# Patient Record
Sex: Male | Born: 1970 | Race: Black or African American | Hispanic: No | Marital: Single | Smoking: Never smoker
Health system: Southern US, Community
[De-identification: ages and names within clinical notes are randomized; demographics above are authoritative.]

## PROBLEM LIST (undated history)

## (undated) HISTORY — PX: CHOLECYSTECTOMY: SHX55

---

## 2010-09-11 ENCOUNTER — Emergency Department: Payer: Self-pay | Admitting: Emergency Medicine

## 2011-02-13 ENCOUNTER — Emergency Department: Payer: Self-pay | Admitting: Emergency Medicine

## 2012-06-21 ENCOUNTER — Emergency Department: Payer: Self-pay | Admitting: Emergency Medicine

## 2019-08-17 ENCOUNTER — Other Ambulatory Visit: Payer: Self-pay

## 2019-08-17 ENCOUNTER — Ambulatory Visit
Admission: EM | Admit: 2019-08-17 | Discharge: 2019-08-17 | Disposition: A | Discharge status: Home / Self Care | Payer: Self-pay | Attending: Internal Medicine | Admitting: Internal Medicine

## 2019-08-17 ENCOUNTER — Encounter: Payer: Self-pay | Admitting: Emergency Medicine

## 2019-08-17 DIAGNOSIS — L739 Follicular disorder, unspecified: Secondary | ICD-10-CM

## 2019-08-17 MED ORDER — CEPHALEXIN 500 MG PO CAPS
ORAL_CAPSULE | ORAL | 0 refills | Status: DC
Start: 2019-08-17 — End: 2020-04-27

## 2019-08-17 NOTE — ED Provider Notes (Addendum)
[facility]    CSN: [id number] Arrival date & time: 08/17/19  1028      History   Chief Complaint Chief Complaint  Patient presents with  . Rash    HPI Levi Payne. is a 49 y.o. male. who presents with onset of L scalp rash x 1 month off and on. He shaves his scalp and has used topical antibacterial ointment on it, but has not help it resolve. This area gets itchy. Denies having any other symptoms.     History reviewed. No pertinent past medical history.  There are no problems to display for this patient.   History reviewed. No pertinent surgical history.     Home Medications    Prior to Admission medications   Medication Sig Start Date End Date Taking? Authorizing Provider  cephALEXin (KEFLEX) 500 MG capsule 2 bid x 10 days 08/17/19   Rodriguez-Southworth, Nettie Elm, PA-C    Family History Family History  Problem Relation Age of Onset  . Healthy Mother     Social History Social History   Tobacco Use  . Smoking status: Never Smoker  . Smokeless tobacco: Never Used  Vaping Use  . Vaping Use: Never used  Substance Use Topics  . Alcohol use: Not Currently  . Drug use: Never     Allergies   Patient has no known allergies.   Review of Systems Review of Systems  Cardiovascular: Negative for chest pain.  Skin: Positive for rash. Negative for color change, pallor and wound.       + pruritus  Neurological: Negative for headaches.     Physical Exam Triage Vital Signs ED Triage Vitals  Enc Vitals Group     BP 08/17/19 1045 (!) 187/98     Pulse Rate 08/17/19 1045 64     Resp 08/17/19 1045 17     Temp 08/17/19 1045 98 F (36.7 C)     Temp Source 08/17/19 1045 Oral     SpO2 08/17/19 1045 99 %     Weight 08/17/19 1043 (!) 380 lb (172.4 kg)     Height 08/17/19 1043 5\' 7"  (1.702 m)     Head Circumference --      Peak Flow --      Pain Score 08/17/19 1043 0     Pain Loc --      Pain Edu? --      Excl. in GC? --    No data  found.  Updated Vital Signs BP (!) 187/98 (BP Location: Left Arm)   Pulse 64   Temp 98 F (36.7 C) (Oral)   Resp 17   Ht 5\' 7"  (1.702 m)   Wt (!) 380 lb (172.4 kg)   SpO2 99%   BMI 59.52 kg/m   Visual Acuity Right Eye Distance:   Left Eye Distance:   Bilateral Distance:    Right Eye Near:   Left Eye Near:    Bilateral Near:     Physical Exam Constitutional:      General: He is not in acute distress.    Appearance: He is obese. He is not toxic-appearing.  HENT:     Head: Atraumatic.     Comments: Has multiple pustules on his L lateral scalp, but no erythema around it.     Right Ear: External ear normal.     Left Ear: External ear normal.     Nose: Nose normal.  Eyes:     General: No scleral icterus.  Conjunctiva/sclera: Conjunctivae normal.  Pulmonary:     Effort: Pulmonary effort is normal.  Musculoskeletal:        General: Normal range of motion.     Cervical back: Neck supple.  Skin:    General: Skin is warm and dry.     Findings: Rash present.  Neurological:     Mental Status: He is alert and oriented to person, place, and time.  Psychiatric:        Mood and Affect: Mood normal.        Behavior: Behavior normal.        Thought Content: Thought content normal.        Judgment: Judgment normal.      UC Treatments / Results  Labs (all labs ordered are listed, but only abnormal results are displayed) Labs Reviewed - No data to display  EKG   Radiology No results found.  Procedures Procedures (including critical care time)  Medications Ordered in UC Medications - No data to display  Initial Impression / Assessment and Plan / UC Course  I have reviewed the triage vital signs and the nursing notes. Has folliculitis and advised to stop shaving, scrub scalp with rough sponge and I placed him on Keflex as noted.  Final Clinical Impressions(s) / UC Diagnoses   Final diagnoses:  Folliculitis   Discharge Instructions   None    ED  Prescriptions    Medication Sig Dispense Auth. Provider   cephALEXin (KEFLEX) 500 MG capsule 2 bid x 10 days 40 capsule Rodriguez-Southworth, Nettie Elm, PA-C     PDMP not reviewed this encounter.   Garey Ham, PA-C 08/17/19 1107    Rodriguez-Southworth, [place], PA-C 08/17/19 1107

## 2019-08-17 NOTE — ED Triage Notes (Signed)
Patient c/o itchy rash on the left side of his scalp for the past month.

## 2019-09-17 ENCOUNTER — Ambulatory Visit (INDEPENDENT_AMBULATORY_CARE_PROVIDER_SITE_OTHER): Payer: Self-pay

## 2019-09-17 ENCOUNTER — Ambulatory Visit
Admission: EM | Admit: 2019-09-17 | Discharge: 2019-09-17 | Disposition: A | Discharge status: Home / Self Care | Payer: Self-pay | Attending: Family Medicine | Admitting: Family Medicine

## 2019-09-17 ENCOUNTER — Other Ambulatory Visit: Payer: Self-pay

## 2019-09-17 DIAGNOSIS — K8020 Calculus of gallbladder without cholecystitis without obstruction: Secondary | ICD-10-CM | POA: Insufficient documentation

## 2019-09-17 DIAGNOSIS — Z79899 Other long term (current) drug therapy: Secondary | ICD-10-CM | POA: Insufficient documentation

## 2019-09-17 DIAGNOSIS — R1013 Epigastric pain: Secondary | ICD-10-CM | POA: Insufficient documentation

## 2019-09-17 DIAGNOSIS — Z20822 Contact with and (suspected) exposure to covid-19: Secondary | ICD-10-CM | POA: Insufficient documentation

## 2019-09-17 LAB — CBC WITH DIFFERENTIAL/PLATELET
Abs Immature Granulocytes: 0.07 10*3/uL (ref 0.00–0.07)
Basophils Absolute: 0.1 10*3/uL (ref 0.0–0.1)
Basophils Relative: 0 %
Eosinophils Absolute: 0.1 10*3/uL (ref 0.0–0.5)
Eosinophils Relative: 1 %
HCT: 45.5 % (ref 39.0–52.0)
Hemoglobin: 15.4 g/dL (ref 13.0–17.0)
Immature Granulocytes: 1 %
Lymphocytes Relative: 19 %
Lymphs Abs: 2.6 10*3/uL (ref 0.7–4.0)
MCH: 32.9 pg (ref 26.0–34.0)
MCHC: 33.8 g/dL (ref 30.0–36.0)
MCV: 97.2 fL (ref 80.0–100.0)
Monocytes Absolute: 0.7 10*3/uL (ref 0.1–1.0)
Monocytes Relative: 5 %
Neutro Abs: 10.3 10*3/uL — ABNORMAL HIGH (ref 1.7–7.7)
Neutrophils Relative %: 74 %
Platelets: 279 10*3/uL (ref 150–400)
RBC: 4.68 MIL/uL (ref 4.22–5.81)
RDW: 12.8 % (ref 11.5–15.5)
WBC: 13.8 10*3/uL — ABNORMAL HIGH (ref 4.0–10.5)
nRBC: 0 % (ref 0.0–0.2)

## 2019-09-17 LAB — COMPREHENSIVE METABOLIC PANEL
ALT: 116 U/L — ABNORMAL HIGH (ref 0–44)
AST: 89 U/L — ABNORMAL HIGH (ref 15–41)
Albumin: 4.1 g/dL (ref 3.5–5.0)
Alkaline Phosphatase: 96 U/L (ref 38–126)
Anion gap: 10 (ref 5–15)
BUN: 9 mg/dL (ref 6–20)
CO2: 27 mmol/L (ref 22–32)
Calcium: 8.8 mg/dL — ABNORMAL LOW (ref 8.9–10.3)
Chloride: 101 mmol/L (ref 98–111)
Creatinine, Ser: 0.78 mg/dL (ref 0.61–1.24)
GFR calc Af Amer: >60 mL/min (ref >60)
GFR calc non Af Amer: >60 mL/min (ref >60)
Glucose, Bld: 110 mg/dL — ABNORMAL HIGH (ref 70–99)
Potassium: 4.2 mmol/L (ref 3.5–5.1)
Sodium: 138 mmol/L (ref 135–145)
Total Bilirubin: 3.9 mg/dL — ABNORMAL HIGH (ref 0.3–1.2)
Total Protein: 8.3 g/dL — ABNORMAL HIGH (ref 6.5–8.1)

## 2019-09-17 LAB — BILIRUBIN, DIRECT: Bilirubin, Direct: 2.1 mg/dL — ABNORMAL HIGH (ref 0.0–0.2)

## 2019-09-17 LAB — LIPASE, BLOOD: Lipase: 42 U/L (ref 11–51)

## 2019-09-17 MED ORDER — PANTOPRAZOLE SODIUM 40 MG PO TBEC: 40.0000 mg | DELAYED_RELEASE_TABLET | Freq: Every day | ORAL | 0 refills | Status: DC

## 2019-09-17 MED ORDER — ONDANSETRON HCL 4 MG PO TABS: 4.0000 mg | ORAL_TABLET | Freq: Three times a day (TID) | ORAL | 0 refills | Status: DC | PRN

## 2019-09-17 NOTE — Discharge Instructions (Addendum)
Medication as prescribed.  I am referring you to general surgery.  If you worsen, go to the hospital.  Take care  Dr. Adriana Simas

## 2019-09-17 NOTE — ED Provider Notes (Signed)
[facility]    CSN: [id number] Arrival date & time: 09/17/19  8676      History   Chief Complaint Chief Complaint  Patient presents with  . Emesis  . Chills  . Abdominal Pain  . Fever    HPI  49 year old male presents with the above complaints.  Symptoms started 2 days ago.  He reports nausea, vomiting, upper abdominal pain.  Also reports subjective fever and chills.  Currently afebrile.  Patient has been backside against COVID-19.  No respiratory symptoms.  No no relieving factors.  No other associated symptoms.  No other complaints.  Home Medications    Prior to Admission medications   Medication Sig Start Date End Date Taking? Authorizing Provider  cephALEXin (KEFLEX) 500 MG capsule 2 bid x 10 days 08/17/19   Rodriguez-Southworth, Nettie Elm, PA-C  ondansetron (ZOFRAN) 4 MG tablet Take 1 tablet (4 mg total) by mouth every 8 (eight) hours as needed for nausea or vomiting. 09/17/19   Tommie Sams, DO  pantoprazole (PROTONIX) 40 MG tablet Take 1 tablet (40 mg total) by mouth daily. 09/17/19   Tommie Sams, DO    Family History Family History  Problem Relation Age of Onset  . Healthy Mother     Social History Social History   Tobacco Use  . Smoking status: Never Smoker  . Smokeless tobacco: Never Used  Vaping Use  . Vaping Use: Never used  Substance Use Topics  . Alcohol use: Not Currently  . Drug use: Never     Allergies   Patient has no known allergies.   Review of Systems Review of Systems  Constitutional: Positive for chills and fever.  Gastrointestinal: Positive for abdominal pain, nausea and vomiting.   Physical Exam Triage Vital Signs ED Triage Vitals  Enc Vitals Group     BP 09/17/19 0903 (!) 177/111     Pulse Rate 09/17/19 0903 71     Resp 09/17/19 0903 18     Temp 09/17/19 0903 98.9 F (37.2 C)     Temp Source 09/17/19 0903 Oral     SpO2 09/17/19 0903 99 %     Weight 09/17/19 0842 (!) 385 lb (174.6 kg)     Height 09/17/19 0842  5\' 7"  (1.702 m)     Head Circumference --      Peak Flow --      Pain Score 09/17/19 1116 0     Pain Loc --      Pain Edu? --      Excl. in GC? --    Updated Vital Signs BP (!) 177/111 (BP Location: Left Arm)   Pulse 71   Temp 98.9 F (37.2 C) (Oral)   Resp 18   Ht 5\' 7"  (1.702 m)   Wt (!) 174.6 kg   SpO2 99%   BMI 60.30 kg/m   Visual Acuity Right Eye Distance:   Left Eye Distance:   Bilateral Distance:    Right Eye Near:   Left Eye Near:    Bilateral Near:     Physical Exam Vitals and nursing note reviewed.  Constitutional:      Appearance: Normal appearance. He is well-developed.     Comments: Morbidly obese.  No acute distress.  HENT:     Head: Normocephalic and atraumatic.  Eyes:     General:        Right eye: No discharge.        Left eye: No discharge.  Conjunctiva/sclera: Conjunctivae normal.  Cardiovascular:     Rate and Rhythm: Normal rate and regular rhythm.     Heart sounds: No murmur heard.   Pulmonary:     Effort: Pulmonary effort is normal.     Breath sounds: Normal breath sounds. No wheezing, rhonchi or rales.  Abdominal:     General: There is no distension.     Palpations: Abdomen is soft.     Comments: Tenderness to palpation in the epigastric region.  Neurological:     Mental Status: He is alert.  Psychiatric:        Mood and Affect: Mood normal.        Behavior: Behavior normal.    UC Treatments / Results  Labs (all labs ordered are listed, but only abnormal results are displayed) Labs Reviewed  COMPREHENSIVE METABOLIC PANEL - Abnormal; Notable for the following components:      Result Value   Glucose, Bld 110 (*)    Calcium 8.8 (*)    Total Protein 8.3 (*)    AST 89 (*)    ALT 116 (*)    Total Bilirubin 3.9 (*)    All other components within normal limits  CBC WITH DIFFERENTIAL/PLATELET - Abnormal; Notable for the following components:   WBC 13.8 (*)    Neutro Abs 10.3 (*)    All other components within normal limits    SARS CORONAVIRUS 2 (TAT 6-24 HRS)  LIPASE, BLOOD  BILIRUBIN, DIRECT    EKG   Radiology US Abdomen Limited RUQ  Result Date: 09/17/2019 CLINICAL DATA:  Epigastric pain for 3 days EXAM: ULTRASOUND ABDOMEN LIMITED RIGHT UPPER QUADRANT COMPARISON:  None. FINDINGS: Gallbladder: Cholelithiasis/sludge filling most of the gallbladder. Where the wall is visible there is no thickening. No pericholecystic edema or focal tenderness. Common bile duct: Diameter: 5 mm Liver: Limited visualization of the liver with no focal lesions seen. Portal vein is patent on color Doppler imaging with normal direction of blood flow towards the liver. IMPRESSION: Cholelithiasis without findings of acute cholecystitis. Electronically Signed   By: Marnee Spring M.D.   On: 09/17/2019 10:28    Procedures Procedures (including critical care time)  Medications Ordered in UC Medications - No data to display  Initial Impression / Assessment and Plan / UC Course  I have reviewed the triage vital signs and the nursing notes.  Pertinent labs & imaging results that were available during my care of the patient were reviewed by me and considered in my medical decision making (see chart for details).    49 year old male presents with epigastric pain, nausea, vomiting.  No documented fever.  Afebrile currently.  Laboratory studies notable for leukocytosis of 13.8.  Patient also had elevated LFTs but a normal alkaline phosphatase.  Bili elevated at 3.9.  Direct bilirubin is pending at this time.  Right upper quadrant ultrasound was obtained and revealed cholelithiasis without evidence of acute cholecystitis.  No choledocholithiasis.  Patient stable to go home at this time.  Awaiting further evaluation of direct and indirect bilirubin.  Placed on Protonix.  Zofran as needed.  Referring to general surgery.  Final Clinical Impressions(s) / UC Diagnoses   Final diagnoses:  Epigastric pain  Calculus of gallbladder without  cholecystitis without obstruction     Discharge Instructions     Medication as prescribed.  I am referring you to general surgery.  If you worsen, go to the hospital.  Take care  Dr. Adriana Simas    ED Prescriptions  Medication Sig Dispense Auth. Provider   ondansetron (ZOFRAN) 4 MG tablet Take 1 tablet (4 mg total) by mouth every 8 (eight) hours as needed for nausea or vomiting. 20 tablet Georgene Kopper G, DO   pantoprazole (PROTONIX) 40 MG tablet Take 1 tablet (40 mg total) by mouth daily. 30 tablet Tommie Sams, DO     PDMP not reviewed this encounter.   Tommie Sams, [place] 09/17/19 1206

## 2019-09-17 NOTE — ED Triage Notes (Signed)
Patient in today w/ c/o vomiting, fever, chills, and abdominal pain x 2 days. Patient received his 2nd dose of Moderna vaccine 09/07/2019. Patient denies loss of taste or smell, cough, or sinus congestion and drainage.

## 2019-09-18 ENCOUNTER — Other Ambulatory Visit: Payer: Self-pay

## 2019-09-18 ENCOUNTER — Emergency Department
Admission: EM | Admit: 2019-09-18 | Discharge: 2019-09-18 | Disposition: A | Discharge status: Home / Self Care | Payer: Self-pay | Attending: Emergency Medicine | Admitting: Emergency Medicine

## 2019-09-18 DIAGNOSIS — K8020 Calculus of gallbladder without cholecystitis without obstruction: Secondary | ICD-10-CM | POA: Insufficient documentation

## 2019-09-18 DIAGNOSIS — R1011 Right upper quadrant pain: Secondary | ICD-10-CM | POA: Insufficient documentation

## 2019-09-18 DIAGNOSIS — Z5321 Procedure and treatment not carried out due to patient leaving prior to being seen by health care provider: Secondary | ICD-10-CM | POA: Insufficient documentation

## 2019-09-18 LAB — URINALYSIS, COMPLETE (UACMP) WITH MICROSCOPIC
Bacteria, UA: NONE SEEN
Specific Gravity, Urine: 1.034 — ABNORMAL HIGH (ref 1.005–1.030)

## 2019-09-18 LAB — COMPREHENSIVE METABOLIC PANEL
ALT: 131 U/L — ABNORMAL HIGH (ref 0–44)
AST: 98 U/L — ABNORMAL HIGH (ref 15–41)
Albumin: 4.2 g/dL (ref 3.5–5.0)
Alkaline Phosphatase: 108 U/L (ref 38–126)
Anion gap: 12 (ref 5–15)
BUN: 10 mg/dL (ref 6–20)
CO2: 26 mmol/L (ref 22–32)
Calcium: 9.1 mg/dL (ref 8.9–10.3)
Chloride: 99 mmol/L (ref 98–111)
Creatinine, Ser: 0.85 mg/dL (ref 0.61–1.24)
GFR calc Af Amer: >60 mL/min (ref >60)
GFR calc non Af Amer: >60 mL/min (ref >60)
Glucose, Bld: 101 mg/dL — ABNORMAL HIGH (ref 70–99)
Potassium: 3.5 mmol/L (ref 3.5–5.1)
Sodium: 137 mmol/L (ref 135–145)
Total Bilirubin: 7.8 mg/dL — ABNORMAL HIGH (ref 0.3–1.2)
Total Protein: 8.5 g/dL — ABNORMAL HIGH (ref 6.5–8.1)

## 2019-09-18 LAB — CBC
HCT: 47 % (ref 39.0–52.0)
Hemoglobin: 16.4 g/dL (ref 13.0–17.0)
MCH: 33.1 pg (ref 26.0–34.0)
MCHC: 34.9 g/dL (ref 30.0–36.0)
MCV: 94.8 fL (ref 80.0–100.0)
Platelets: 280 10*3/uL (ref 150–400)
RBC: 4.96 MIL/uL (ref 4.22–5.81)
RDW: 12.6 % (ref 11.5–15.5)
WBC: 17 10*3/uL — ABNORMAL HIGH (ref 4.0–10.5)
nRBC: 0 % (ref 0.0–0.2)

## 2019-09-18 LAB — SARS CORONAVIRUS 2 (TAT 6-24 HRS): SARS Coronavirus 2: NEGATIVE

## 2019-09-18 LAB — LIPASE, BLOOD: Lipase: 2227 U/L — ABNORMAL HIGH (ref 11–51)

## 2019-09-18 NOTE — ED Triage Notes (Signed)
RUQ pain ongoing for past week, seen at [facility] yesterday and dx with gallstones. Referred to surgeon. Pt states pain increasing overnight.

## 2019-09-18 NOTE — ED Notes (Signed)
Patient not seen in the lobby. Will continue to call.

## 2019-09-26 ENCOUNTER — Ambulatory Visit: Payer: Self-pay | Admitting: Surgery

## 2019-10-04 ENCOUNTER — Encounter: Payer: Self-pay | Admitting: *Deleted

## 2019-11-07 ENCOUNTER — Encounter: Payer: Self-pay | Admitting: *Deleted

## 2020-04-27 ENCOUNTER — Other Ambulatory Visit: Payer: Self-pay

## 2020-04-27 ENCOUNTER — Ambulatory Visit (INDEPENDENT_AMBULATORY_CARE_PROVIDER_SITE_OTHER): Payer: Self-pay

## 2020-04-27 ENCOUNTER — Ambulatory Visit
Admission: EM | Admit: 2020-04-27 | Discharge: 2020-04-27 | Disposition: A | Discharge status: Home / Self Care | Payer: Self-pay | Attending: Family Medicine | Admitting: Family Medicine

## 2020-04-27 DIAGNOSIS — R0602 Shortness of breath: Secondary | ICD-10-CM

## 2020-04-27 NOTE — ED Triage Notes (Signed)
Patient states that he has been noticing some shortness of breath and sneezing with coughing since Friday.States that he worked out in the yard so is unsure if the pollen set his symptoms off.

## 2020-04-27 NOTE — ED Provider Notes (Signed)
[facility]    CSN: [id number] Arrival date & time: 04/27/20  0802      History   Chief Complaint Chief Complaint  Patient presents with  . Shortness of Breath   HPI  50 year old male presents with shortness of breath.  Patient reports that this started on Friday after he was helping someone move.  He states that the area was quite dusty.  He states that he feels like he can get a good deep breath.  He has had some intermittent cough.  No fever.  Patient states that he recently had a "rattling" in his chest but this cleared after cough.  No relieving factors.  No other reported symptoms.  No other complaints or concerns at this time.  Home Medications    Prior to Admission medications   Medication Sig Start Date End Date Taking? Authorizing Provider  pantoprazole (PROTONIX) 40 MG tablet Take 1 tablet (40 mg total) by mouth daily. 09/17/19 04/27/20  Tommie Sams, DO    Family History Family History  Problem Relation Age of Onset  . Healthy Mother     Social History Social History   Tobacco Use  . Smoking status: Never Smoker  . Smokeless tobacco: Never Used  Vaping Use  . Vaping Use: Never used  Substance Use Topics  . Alcohol use: Not Currently  . Drug use: Never     Allergies   Patient has no known allergies.   Review of Systems Review of Systems  Constitutional: Negative.   Respiratory: Positive for cough and shortness of breath.    Physical Exam Triage Vital Signs ED Triage Vitals  Enc Vitals Group     BP 04/27/20 0814 (!) 184/93     Pulse Rate 04/27/20 0814 (!) 58     Resp 04/27/20 0814 17     Temp 04/27/20 0814 97.9 F (36.6 C)     Temp Source 04/27/20 0814 Oral     SpO2 04/27/20 0814 100 %     Weight 04/27/20 0812 (!) 356 lb (161.5 kg)     Height 04/27/20 0812 5' 7.5" (1.715 m)     Head Circumference --      Peak Flow --      Pain Score 04/27/20 0812 0     Pain Loc --      Pain Edu? --      Excl. in GC? --    No data  found.  Updated Vital Signs BP (!) 184/93 (BP Location: Right Arm)   Pulse (!) 58   Temp 97.9 F (36.6 C) (Oral)   Resp 17   Ht 5' 7.5" (1.715 m)   Wt (!) 161.5 kg   SpO2 100%   BMI 54.93 kg/m   Visual Acuity Right Eye Distance:   Left Eye Distance:   Bilateral Distance:    Right Eye Near:   Left Eye Near:    Bilateral Near:     Physical Exam Vitals and nursing note reviewed.  Constitutional:      General: He is not in acute distress.    Appearance: Normal appearance. He is not ill-appearing.  HENT:     Head: Normocephalic and atraumatic.  Eyes:     General:        Right eye: No discharge.        Left eye: No discharge.     Conjunctiva/sclera: Conjunctivae normal.  Cardiovascular:     Rate and Rhythm: Regular rhythm. Bradycardia present.  Pulmonary:  Effort: Pulmonary effort is normal.     Breath sounds: Normal breath sounds. No wheezing, rhonchi or rales.  Neurological:     Mental Status: He is alert.  Psychiatric:        Mood and Affect: Mood normal.        Behavior: Behavior normal.    UC Treatments / Results  Labs (all labs ordered are listed, but only abnormal results are displayed) Labs Reviewed - No data to display  EKG   Radiology DG Chest 2 View  Result Date: 04/27/2020 CLINICAL DATA:  50 year old male with shortness of breath EXAM: CHEST - 2 VIEW COMPARISON:  None FINDINGS: The cardiopericardial silhouette is within normal limits. Enlargement of the main and central pulmonary arteries is present suggesting underlying pulmonary arterial hypertension. Difficult to completely exclude mediastinal adenopathy, particularly on the right. Diffuse mild interstitial prominence with Kerley B-lines in the periphery of the right lung consistent with mild interstitial edema. No pneumothorax or pleural effusion. No acute osseous abnormality. IMPRESSION: 1. Mild interstitial pulmonary edema. 2. Enlarged main and central pulmonary arteries concerning for pulmonary  arterial hypertension. Difficult to exclude hilar adenopathy, particularly on the right. Consider further evaluation with contrast-enhanced chest CT. Electronically Signed   By: Malachy Moan M.D.   On: 04/27/2020 08:46    Procedures Procedures (including critical care time)  Medications Ordered in UC Medications - No data to display  Initial Impression / Assessment and Plan / UC Course  I have reviewed the triage vital signs and the nursing notes.  Pertinent labs & imaging results that were available during my care of the patient were reviewed by me and considered in my medical decision making (see chart for details).    50 year old male presents with shortness of breath.  Chest x-ray was obtained.  I have reviewed the x-ray.  Interpretation: Pulmonary edema noted.  Hilar prominence particularly on the right.  Patient is hemodynamically stable at this point in time.  However, he needs further evaluation regarding this new finding.  He will need CT chest.  I do not have this available to me at this time.  I have advised him that he should go to the hospital for further evaluation and management.  He is going to [facility].  Final Clinical Impressions(s) / UC Diagnoses   Final diagnoses:  SOB (shortness of breath)     Discharge Instructions     Go directly to the ER for additional work up/evaluation.  Take care  Dr. Adriana Simas     ED Prescriptions    None     PDMP not reviewed this encounter.   Everlene Other [place], [place] 04/27/20 [phone]

## 2020-04-27 NOTE — Discharge Instructions (Signed)
Go directly to the ER for additional work up/evaluation.  Take care  Dr. Adriana Simas

## 2020-07-04 ENCOUNTER — Encounter: Payer: Self-pay | Admitting: Emergency Medicine

## 2020-07-04 ENCOUNTER — Other Ambulatory Visit: Payer: Self-pay

## 2020-07-04 ENCOUNTER — Ambulatory Visit
Admission: EM | Admit: 2020-07-04 | Discharge: 2020-07-04 | Disposition: A | Discharge status: Home / Self Care | Payer: Self-pay | Attending: Internal Medicine | Admitting: Internal Medicine

## 2020-07-04 DIAGNOSIS — R17 Unspecified jaundice: Secondary | ICD-10-CM

## 2020-07-04 NOTE — ED Triage Notes (Signed)
Patient states that he noticed that his eyes are yellow this morning.  Patient denies any pain.  Patient denies any vision problems.

## 2020-07-04 NOTE — ED Provider Notes (Addendum)
[facility]    CSN: [id number] Arrival date & time: 07/04/20  1556      History   Chief Complaint Chief Complaint  Patient presents with   Eye Problem    HPI Starling Christofferson. is a 50 y.o. male who presents due to his fiance noticing his eyes are yellow. He denies abdominal pain, nausea. Only gets mild itching on his back. Had cholecystectomy done last year. Only drinks on occasion. Takes Tylenol or Aleve for pain, but not all the time.     History reviewed. No pertinent past medical history.  There are no problems to display for this patient.   Past Surgical History:  Procedure Laterality Date   CHOLECYSTECTOMY         Home Medications    Prior to Admission medications   Medication Sig Start Date End Date Taking? Authorizing Provider  pantoprazole (PROTONIX) 40 MG tablet Take 1 tablet (40 mg total) by mouth daily. 09/17/19 04/27/20  Tommie Sams, DO    Family History Family History  Problem Relation Age of Onset   Healthy Mother     Social History Social History   Tobacco Use   Smoking status: Never   Smokeless tobacco: Never  Vaping Use   Vaping Use: Never used  Substance Use Topics   Alcohol use: Not Currently   Drug use: Never     Allergies   Patient has no known allergies.   Review of Systems Review of Systems + icterus, the rest is neg  Physical Exam Triage Vital Signs ED Triage Vitals  Enc Vitals Group     BP 07/04/20 1609 (!) 142/95     Pulse Rate 07/04/20 1609 72     Resp 07/04/20 1609 16     Temp 07/04/20 1609 98.5 F (36.9 C)     Temp Source 07/04/20 1609 Oral     SpO2 07/04/20 1609 100 %     Weight 07/04/20 1607 (!) 330 lb (149.7 kg)     Height 07/04/20 1607 5\' 7"  (1.702 m)     Head Circumference --      Peak Flow --      Pain Score 07/04/20 1607 0     Pain Loc --      Pain Edu? --      Excl. in GC? --    No data found.  Updated Vital Signs BP (!) 142/95 (BP Location: Left Arm)   Pulse 72   Temp 98.5  F (36.9 C) (Oral)   Resp 16   Ht 5\' 7"  (1.702 m)   Wt (!) 330 lb (149.7 kg)   SpO2 100%   BMI 51.69 kg/m   Visual Acuity Right Eye Distance:   Left Eye Distance:   Bilateral Distance:    Right Eye Near:   Left Eye Near:    Bilateral Near:     Physical Exam Constitutional:      Appearance: He is obese.  HENT:     Head: Normocephalic.     Right Ear: External ear normal.     Left Ear: External ear normal.  Eyes:     General: Scleral icterus present.     Conjunctiva/sclera: Conjunctivae normal.  Pulmonary:     Effort: Pulmonary effort is normal.  Abdominal:     Palpations: Abdomen is soft.     Tenderness: There is no abdominal tenderness. There is no guarding or rebound.  Musculoskeletal:        General: Normal  range of motion.     Cervical back: Neck supple.  Skin:    General: Skin is warm and dry.     Findings: No rash.     Comments: Due to his dark skin, I cant tell that he is jaundiced  Neurological:     Mental Status: He is alert and oriented to person, place, and time.     Gait: Gait normal.  Psychiatric:        Mood and Affect: Mood normal.        Behavior: Behavior normal.        Thought Content: Thought content normal.        Judgment: Judgment normal.     UC Treatments / Results  Labs (all labs ordered are listed, but only abnormal results are displayed) Labs Reviewed - No data to display  EKG   Radiology No results found.  Procedures Procedures (including critical care time)  Medications Ordered in UC Medications - No data to display  Initial Impression / Assessment and Plan / UC Course  I have reviewed the triage vital signs and the nursing notes. Pt declined going to ER for labs and prefers having it as out patient. Since he is not symptomatic I agreed to that. I ordered CMP and hepatitis panel for Monday. We will inform him of his results when back.  Final Clinical Impressions(s) / UC Diagnoses   Final diagnoses:  Icterus  Jaundice    Discharge Instructions   None    ED Prescriptions   None    PDMP not reviewed this encounter.   Garey Ham, PA-C 07/04/20 1653    Rodriguez-Southworth, [place], PA-C 07/07/20 1102

## 2020-07-06 ENCOUNTER — Other Ambulatory Visit: Payer: Self-pay

## 2020-07-06 ENCOUNTER — Other Ambulatory Visit
Admission: RE | Admit: 2020-07-06 | Discharge: 2020-07-06 | Disposition: A | Discharge status: Home / Self Care | Payer: Medicaid Other | Attending: Internal Medicine | Admitting: Internal Medicine

## 2020-07-06 DIAGNOSIS — R17 Unspecified jaundice: Secondary | ICD-10-CM | POA: Insufficient documentation

## 2020-07-06 LAB — COMPREHENSIVE METABOLIC PANEL
ALT: 1080 U/L — ABNORMAL HIGH (ref 0–44)
AST: 903 U/L — ABNORMAL HIGH (ref 15–41)
Albumin: 3 g/dL — ABNORMAL LOW (ref 3.5–5.0)
Alkaline Phosphatase: 237 U/L — ABNORMAL HIGH (ref 38–126)
Anion gap: 6 (ref 5–15)
BUN: 11 mg/dL (ref 6–20)
CO2: 25 mmol/L (ref 22–32)
Calcium: 8.7 mg/dL — ABNORMAL LOW (ref 8.9–10.3)
Chloride: 100 mmol/L (ref 98–111)
Creatinine, Ser: 0.67 mg/dL (ref 0.61–1.24)
GFR, Estimated: >60 mL/min (ref >60)
Glucose, Bld: 96 mg/dL (ref 70–99)
Potassium: 4.7 mmol/L (ref 3.5–5.1)
Sodium: 131 mmol/L — ABNORMAL LOW (ref 135–145)
Total Bilirubin: 18.6 mg/dL (ref 0.3–1.2)
Total Protein: 8 g/dL (ref 6.5–8.1)

## 2020-07-06 LAB — HEPATITIS PANEL, ACUTE
HCV Ab: NONREACTIVE
Hep A IgM: NONREACTIVE
Hep B C IgM: NONREACTIVE
Hepatitis B Surface Ag: NONREACTIVE

## 2021-09-18 IMAGING — US US ABDOMEN LIMITED
1 series · 14 of 25 positions shown · non-contrast
Comparison: None.

CLINICAL DATA: Epigastric pain for 3 days

EXAM:
ULTRASOUND ABDOMEN LIMITED RIGHT UPPER QUADRANT

[Series 1: us abdomen limited · 0.28mm/px · 14 of 41 slices shown]
[im 1/41]
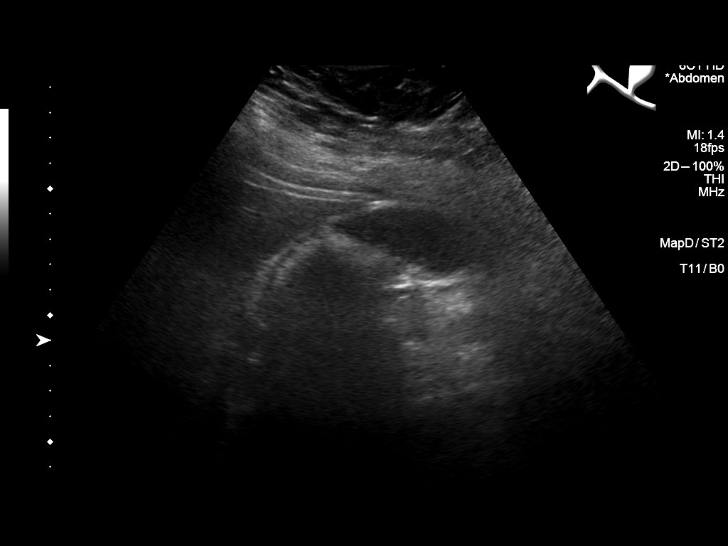
[im 4/41]
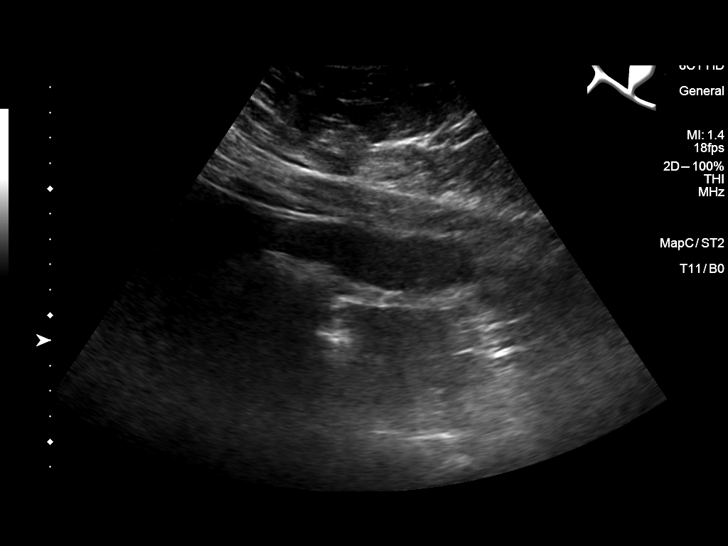
[im 7/41]
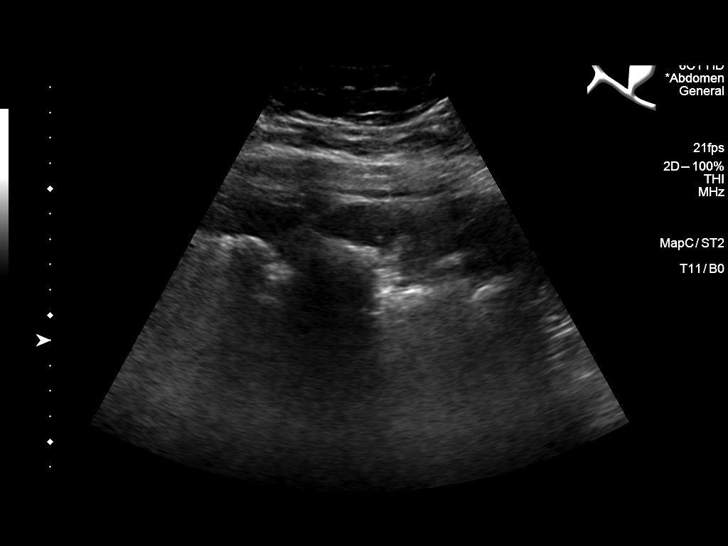
[im 11/41]
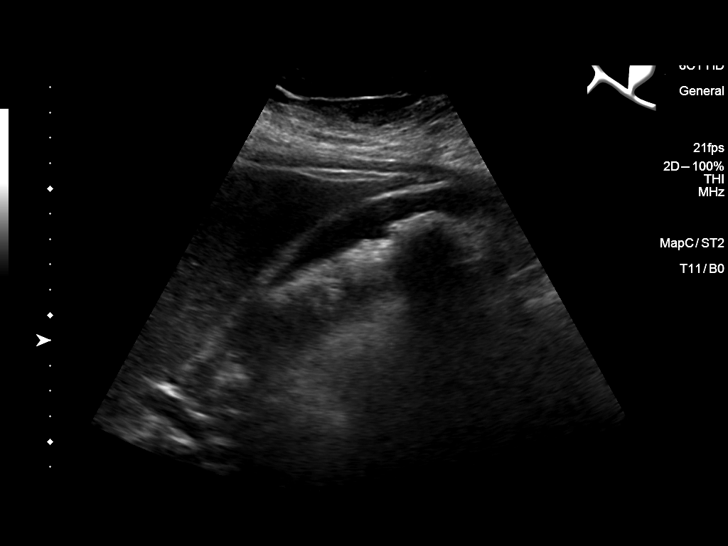
[im 14/41]
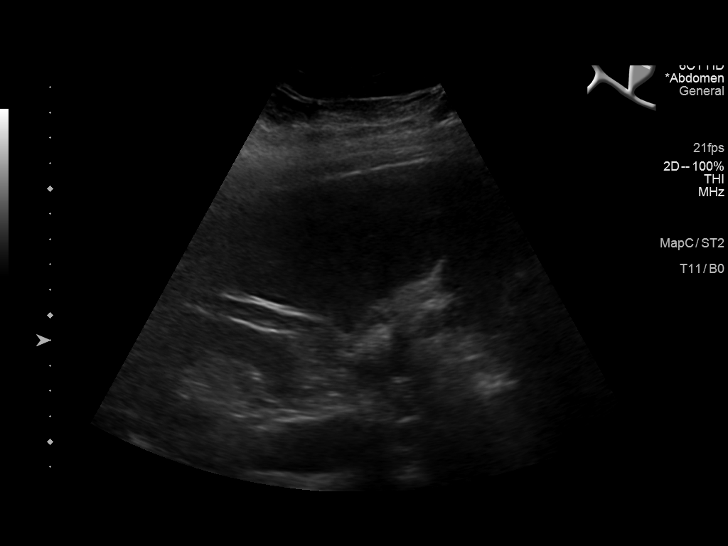
[im 16/41]
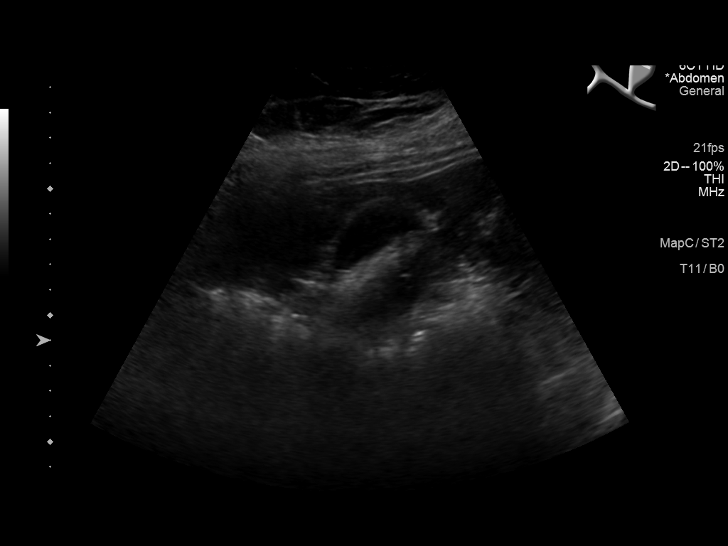
[im 19/41]
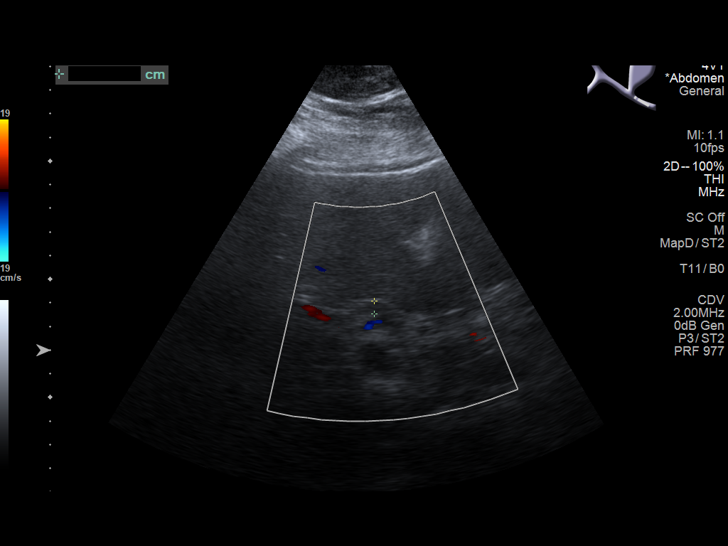
[im 22/41]
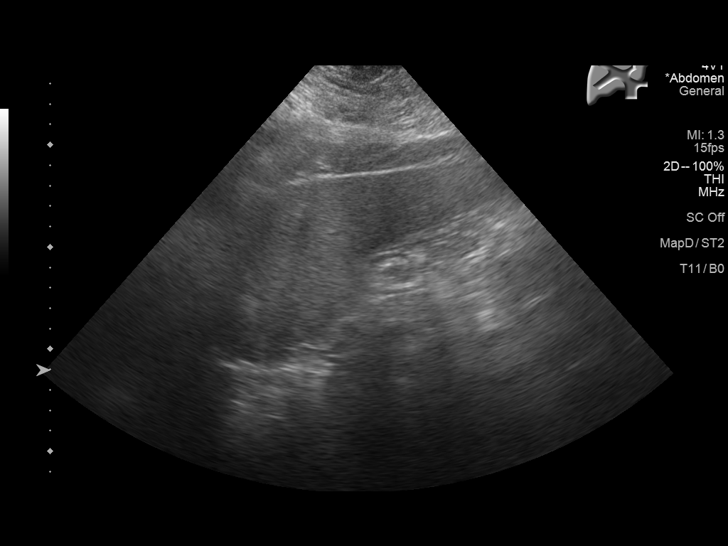
[im 26/41]
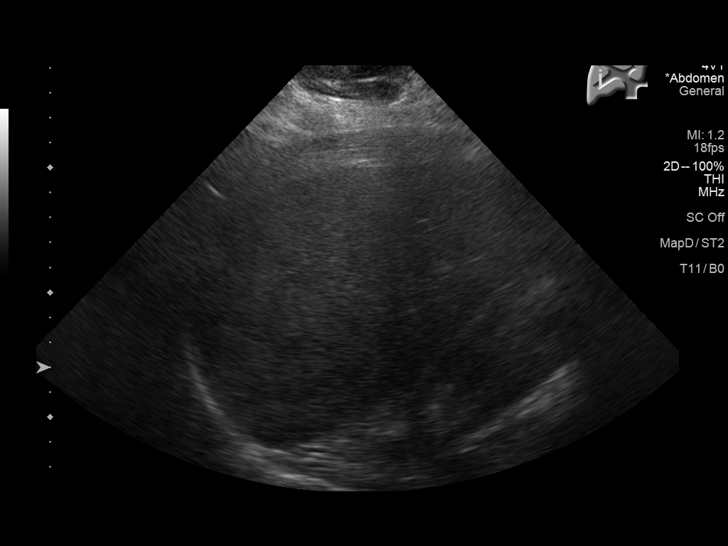
[im 27/41]
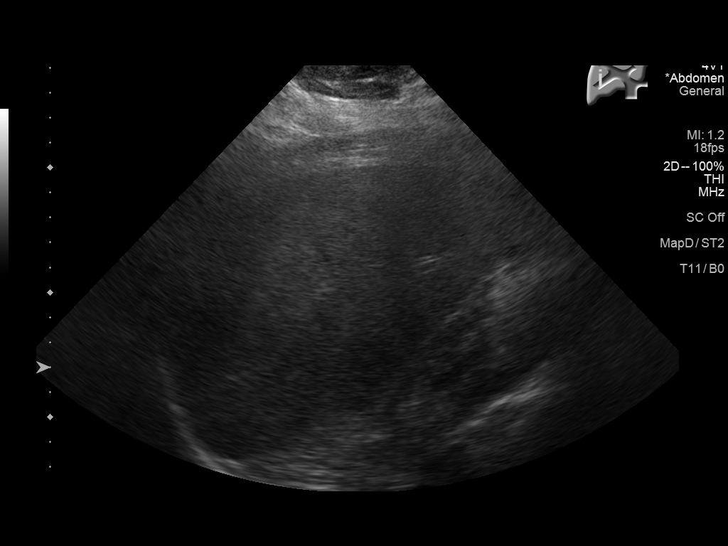
[im 31/41]
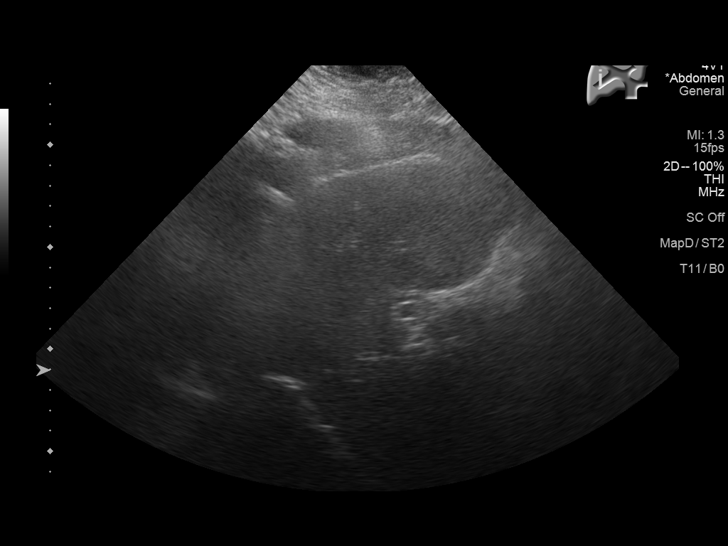
[im 34/41]
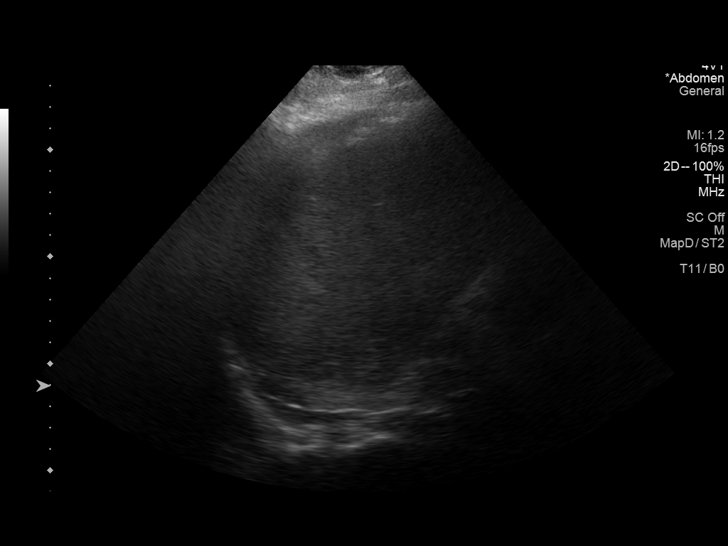
[im 37/41]
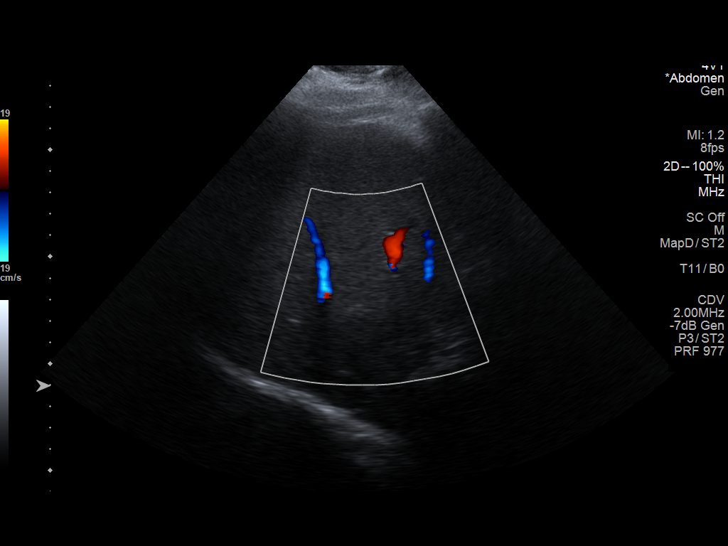
[im 41/41]
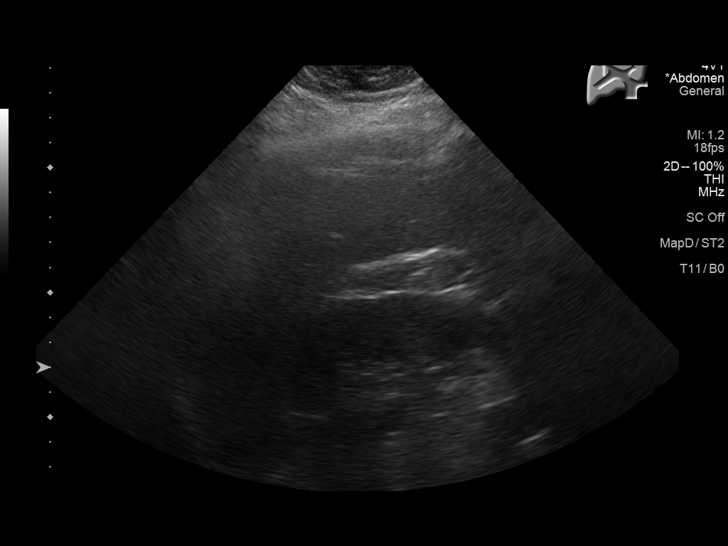

[14 of 25 positions shown; findings below may reference images not displayed]

FINDINGS: Gallbladder:

Cholelithiasis/sludge filling most of the gallbladder. Where the
wall is visible there is no thickening. No pericholecystic edema or
focal tenderness.

Common bile duct:

Diameter: 5 mm

Liver:

Limited visualization of the liver with no focal lesions seen.
Portal vein is patent on color Doppler imaging with normal direction
of blood flow towards the liver.
IMPRESSION: Cholelithiasis without findings of acute cholecystitis.

## 2022-04-29 IMAGING — CR DG CHEST 2V
2 series · 3 of 3 positions shown · non-contrast
Comparison: None

CLINICAL DATA: 49-year-old male with shortness of breath

EXAM:
CHEST - 2 VIEW

[Series 1: chest pa · 0.14mm/px · 2 of 2 slices shown]
[im 1/2]
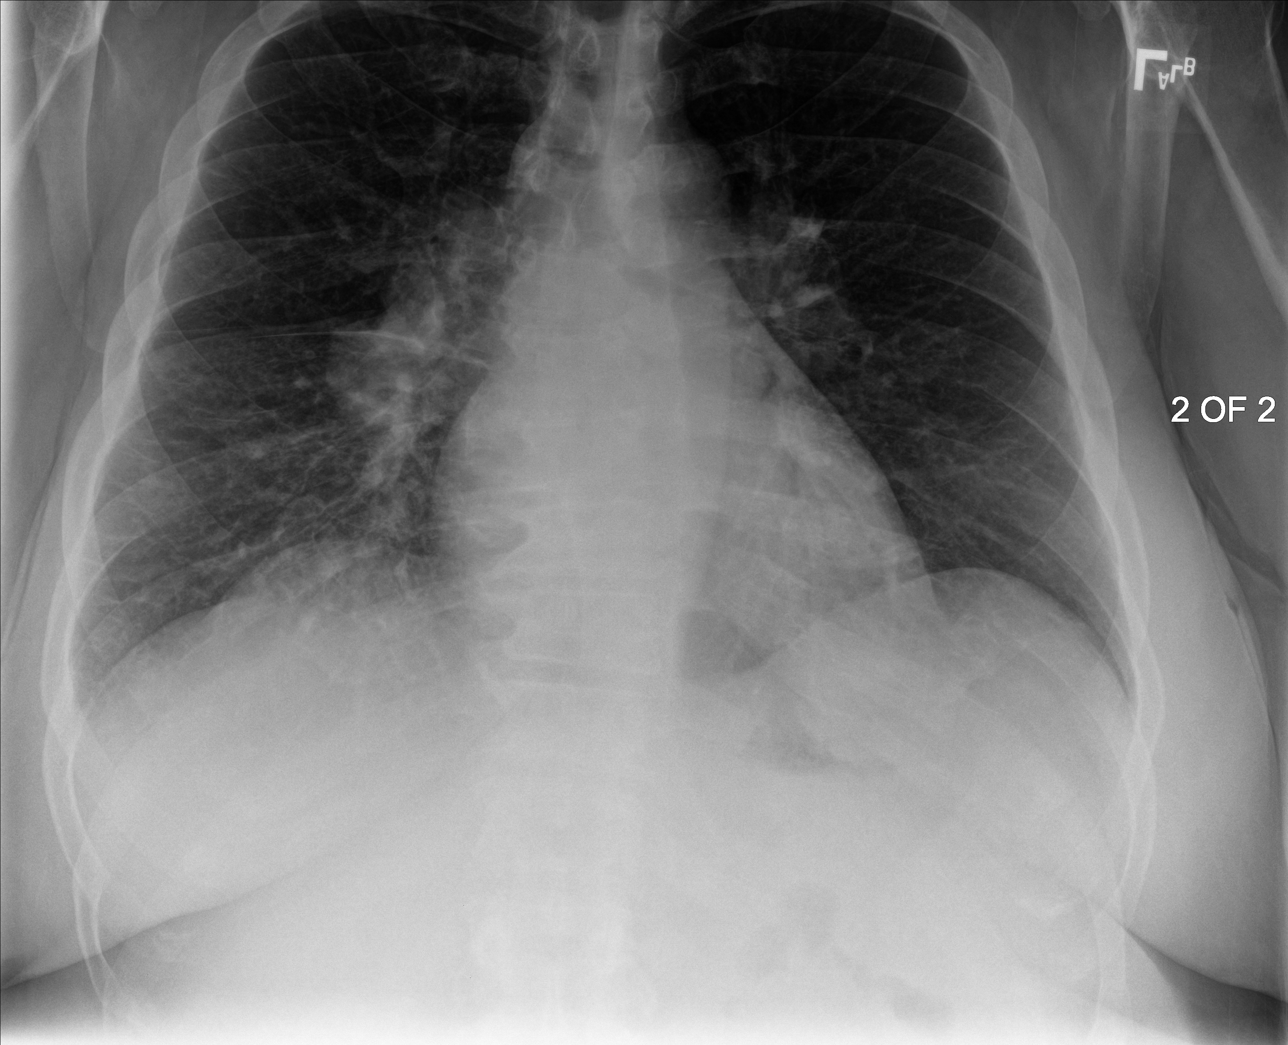
[im 2/2]
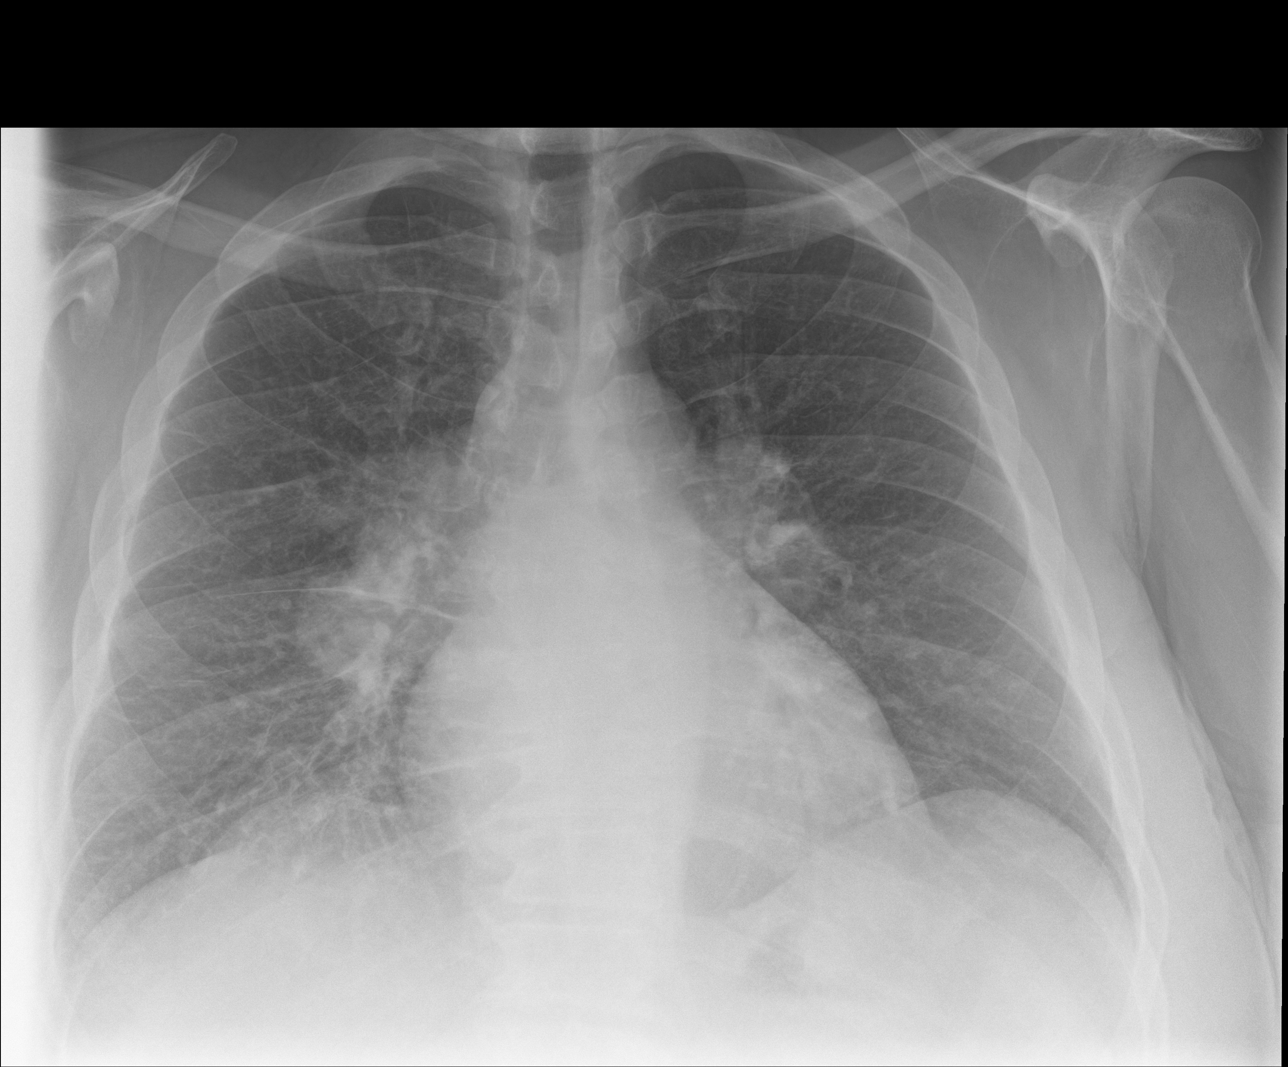

[chest lat]
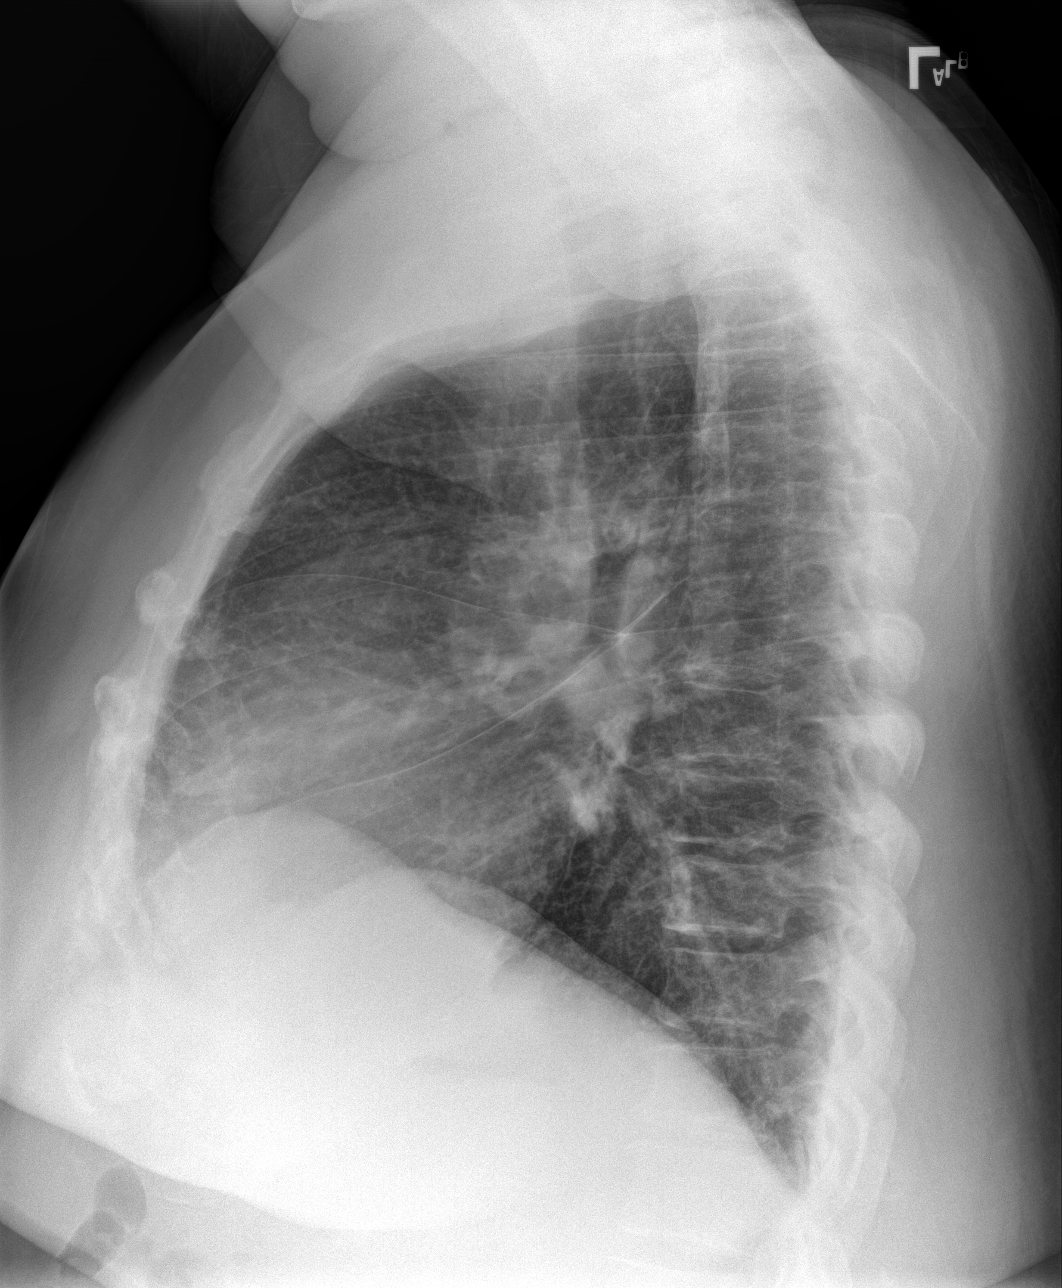

[3 of 3 positions shown; findings below may reference images not displayed]

FINDINGS: The cardiopericardial silhouette is within normal limits.
Enlargement of the main and central pulmonary arteries is present
suggesting underlying pulmonary arterial hypertension. Difficult to
completely exclude mediastinal adenopathy, particularly on the
right. Diffuse mild interstitial prominence with Kerley B-lines in
the periphery of the right lung consistent with mild interstitial
edema. No pneumothorax or pleural effusion. No acute osseous
abnormality.
IMPRESSION: 1. Mild interstitial pulmonary edema.
2. Enlarged main and central pulmonary arteries concerning for
pulmonary arterial hypertension. Difficult to exclude hilar
adenopathy, particularly on the right. Consider further evaluation
with contrast-enhanced chest CT.

## 2022-07-12 DIAGNOSIS — M160 Bilateral primary osteoarthritis of hip: Secondary | ICD-10-CM | POA: Diagnosis not present

## 2022-07-12 DIAGNOSIS — M1612 Unilateral primary osteoarthritis, left hip: Secondary | ICD-10-CM | POA: Diagnosis not present

## 2022-07-16 ENCOUNTER — Ambulatory Visit
Admission: EM | Admit: 2022-07-16 | Discharge: 2022-07-16 | Disposition: A | Discharge status: Home / Self Care | Payer: 59

## 2022-07-16 DIAGNOSIS — L739 Follicular disorder, unspecified: Secondary | ICD-10-CM | POA: Diagnosis not present

## 2022-07-16 DIAGNOSIS — L259 Unspecified contact dermatitis, unspecified cause: Secondary | ICD-10-CM

## 2022-07-16 MED ORDER — DOXYCYCLINE HYCLATE 100 MG PO CAPS: 100.0000 mg | ORAL_CAPSULE | Freq: Two times a day (BID) | ORAL | 0 refills | Status: AC

## 2022-07-16 MED ORDER — TRIAMCINOLONE ACETONIDE 0.1 % EX OINT: 1.0000 | TOPICAL_OINTMENT | Freq: Two times a day (BID) | CUTANEOUS | 1 refills | Status: DC

## 2022-07-16 NOTE — Discharge Instructions (Addendum)
-  Contact dermatitis rash versus folliculitis - Start the ointments which we will treat if it is contact dermatitis/irritant rash. - The antibiotic will treat for folliculitis - Avoid any potential triggers.

## 2022-07-16 NOTE — ED Triage Notes (Signed)
Pt states that he has a rash on his head x 2 weeks. No pain just itching. Used benadryl and hydrocortisone cream with no relief.

## 2022-07-16 NOTE — ED Provider Notes (Signed)
[facility]    CSN: [id number] Arrival date & time: 07/16/22  4098      History   Chief Complaint Chief Complaint  Patient presents with   Rash    HPI Levi Payne. is a 52 y.o. male presenting for itchy bumps on scalp x 2 weeks.  Patient reports contact with Tide which he knows causes irritation to his skin.  Also reports using a brand-new razor and shaving cream when shaving head.  Denies any associated pain.  There is no loss of hair in the area where the bumps are.  Has tried hydrocortisone cream and Benadryl over-the-counter without any improvement in symptoms.  HPI  History reviewed. No pertinent past medical history.  There are no problems to display for this patient.   Past Surgical History:  Procedure Laterality Date   CHOLECYSTECTOMY         Home Medications    Prior to Admission medications   Medication Sig Start Date End Date Taking? Authorizing Provider  doxycycline (VIBRAMYCIN) 100 MG capsule Take 1 capsule (100 mg total) by mouth 2 (two) times daily for 7 days. 07/16/22 07/23/22 Yes Eusebio Friendly B, PA-C  LUTEIN PO Take 1 tablet by mouth daily.   Yes [provider]  meloxicam (MOBIC) 15 MG tablet Take by mouth. 07/12/22  Yes [provider]  triamcinolone ointment (KENALOG) 0.1 % Apply 1 Application topically 2 (two) times daily. 07/16/22  Yes Eusebio Friendly B, PA-C  pantoprazole (PROTONIX) 40 MG tablet Take 1 tablet (40 mg total) by mouth daily. 09/17/19 04/27/20  Tommie Sams, DO    Family History Family History  Problem Relation Age of Onset   Healthy Mother     Social History Social History   Tobacco Use   Smoking status: Never   Smokeless tobacco: Never  Vaping Use   Vaping Use: Never used  Substance Use Topics   Alcohol use: Not Currently   Drug use: Never     Allergies   Hydrocodone-acetaminophen   Review of Systems Review of Systems  Constitutional:  Negative for fatigue and fever.  Skin:   Positive for rash. Negative for color change and wound.  Neurological:  Negative for headaches.     Physical Exam Triage Vital Signs ED Triage Vitals  Enc Vitals Group     BP 07/16/22 0841 (!) 189/112     Pulse Rate 07/16/22 0841 61     Resp 07/16/22 0841 17     Temp 07/16/22 0841 97.9 F (36.6 C)     Temp Source 07/16/22 0841 Oral     SpO2 07/16/22 0841 95 %     Weight 07/16/22 0839 (!) 365 lb (165.6 kg)     Height --      Head Circumference --      Peak Flow --      Pain Score --      Pain Loc --      Pain Edu? --      Excl. in GC? --    No data found.  Updated Vital Signs BP (!) 189/112 (BP Location: Right Arm)   Pulse 61   Temp 97.9 F (36.6 C) (Oral)   Resp 17   Wt (!) 365 lb (165.6 kg)   SpO2 95%   BMI 57.17 kg/m      Physical Exam Vitals and nursing note reviewed.  Constitutional:      General: He is not in acute distress.    Appearance:  Normal appearance. He is well-developed. He is not ill-appearing.  HENT:     Head: Normocephalic and atraumatic.  Eyes:     General: No scleral icterus.    Conjunctiva/sclera: Conjunctivae normal.  Cardiovascular:     Rate and Rhythm: Normal rate.  Pulmonary:     Effort: Pulmonary effort is normal. No respiratory distress.  Musculoskeletal:     Cervical back: Neck supple.  Skin:    General: Skin is warm and dry.     Capillary Refill: Capillary refill takes less than 2 seconds.     Findings: Rash (multiple scattered small erythematous papules and pustules bilateral temporal scalp. No loss of hair in areas of the rash) present.  Neurological:     General: No focal deficit present.     Mental Status: He is alert. Mental status is at baseline.     Motor: No weakness.  Psychiatric:        Mood and Affect: Mood normal.        Behavior: Behavior normal.      UC Treatments / Results  Labs (all labs ordered are listed, but only abnormal results are displayed) Labs Reviewed - No data to  display  EKG   Radiology No results found.  Procedures Procedures (including critical care time)  Medications Ordered in UC Medications - No data to display  Initial Impression / Assessment and Plan / UC Course  I have reviewed the triage vital signs and the nursing notes.  Pertinent labs & imaging results that were available during my care of the patient were reviewed by me and considered in my medical decision making (see chart for details).   52 year old male presents for pruritic rash of scalp.  Symptoms started 2 weeks ago.  Thinks he could have caused irritation secondary to contact with Tide detergent or shaving cream.  Reports using a new razor before shaving his head and onset of the rash.  No improvement with hydrocortisone cream or Benadryl.  Exam is most consistent with contact dermatitis irritant dermatitis versus folliculitis.  Will treat at this time for both possibilities with triamcinolone ointment and doxycycline.  Reviewed avoiding potential triggers.  Reviewed return precautions.   Final Clinical Impressions(s) / UC Diagnoses   Final diagnoses:  Contact dermatitis, unspecified contact dermatitis type, unspecified trigger  Folliculitis     Discharge Instructions      -Contact dermatitis rash versus folliculitis - Start the ointments which we will treat if it is contact dermatitis/irritant rash. - The antibiotic will treat for folliculitis - Avoid any potential triggers.   ED Prescriptions     Medication Sig Dispense Auth. Provider   triamcinolone ointment (KENALOG) 0.1 % Apply 1 Application topically 2 (two) times daily. 30 g Eusebio Friendly B, PA-C   doxycycline (VIBRAMYCIN) 100 MG capsule Take 1 capsule (100 mg total) by mouth 2 (two) times daily for 7 days. 14 capsule Shirlee Latch, PA-C      PDMP not reviewed this encounter.   Shirlee Latch, PA-C 07/16/22 [phone]

## 2022-09-24 ENCOUNTER — Encounter: Payer: Self-pay | Admitting: Emergency Medicine

## 2022-09-24 ENCOUNTER — Ambulatory Visit
Admission: EM | Admit: 2022-09-24 | Discharge: 2022-09-24 | Disposition: A | Discharge status: Home / Self Care | Payer: 59 | Attending: Internal Medicine | Admitting: Internal Medicine

## 2022-09-24 ENCOUNTER — Telehealth: Payer: Self-pay | Admitting: Internal Medicine

## 2022-09-24 DIAGNOSIS — L738 Other specified follicular disorders: Secondary | ICD-10-CM

## 2022-09-24 DIAGNOSIS — I10 Essential (primary) hypertension: Secondary | ICD-10-CM

## 2022-09-24 MED ORDER — DOXYCYCLINE HYCLATE 100 MG PO CAPS: 100.0000 mg | ORAL_CAPSULE | Freq: Two times a day (BID) | ORAL | 0 refills | Status: DC

## 2022-09-24 MED ORDER — AMLODIPINE-OLMESARTAN 5-20 MG PO TABS: 1.0000 | ORAL_TABLET | Freq: Every day | ORAL | 0 refills | Status: DC

## 2022-09-24 MED ORDER — AMLODIPINE-OLMESARTAN 5-20 MG PO TABS: 1.0000 | ORAL_TABLET | Freq: Every day | ORAL | 0 refills | Status: AC

## 2022-09-24 NOTE — ED Triage Notes (Signed)
Patient reports itchy rash on his head for the past 2-3 days.

## 2022-09-24 NOTE — Telephone Encounter (Signed)
Patient called back reporting that [facility] does not cover his insurance and he would like his medication sent to [facility] in [place].

## 2022-09-24 NOTE — ED Provider Notes (Addendum)
[facility]    CSN: [id number] Arrival date & time: 09/24/22  0810      History   Chief Complaint Chief Complaint  Patient presents with   Rash    HPI Levi Payne. is a 52 y.o. male presents urgent care today with complaint of bumps of his scalp.  He reports he noticed this 2 to 3 days ago.  He reports the bumps itch but do not burn.  He reports he shaves his head daily and has recently started using a new shaving cream which he thinks might be causing an allergic reaction.  He does change his razor daily.  He was seen for the same 06/2022 and prescribed doxycycline and triamcinolone cream.  He reports this combination of medications resolved his symptoms.  Of note, his BP at that last visit was 189/112.  His BP today is 173/97.  He reports he was on blood pressure medication years ago but has not been recently.  He does report frequent headaches, intermittent lightheadedness and blurred vision.  He denies chest pain, chest tightness, shortness of breath or swelling in his lower extremities.  He does have a PCP but has not followed up with her recently.  HPI  History reviewed. No pertinent past medical history.  There are no problems to display for this patient.   Past Surgical History:  Procedure Laterality Date   CHOLECYSTECTOMY         Home Medications    Prior to Admission medications   Medication Sig Start Date End Date Taking? Authorizing Provider  amLODipine-olmesartan (AZOR) 5-20 MG tablet Take 1 tablet by mouth daily. 09/24/22  Yes Lorre Munroe, NP  doxycycline (VIBRAMYCIN) 100 MG capsule Take 1 capsule (100 mg total) by mouth 2 (two) times daily. 09/24/22  Yes Lorre Munroe, NP  LUTEIN PO Take 1 tablet by mouth daily.    [provider]  meloxicam (MOBIC) 15 MG tablet Take by mouth. 07/12/22   [provider]  triamcinolone ointment (KENALOG) 0.1 % Apply 1 Application topically 2 (two) times daily. 07/16/22   Shirlee Latch,  PA-C  pantoprazole (PROTONIX) 40 MG tablet Take 1 tablet (40 mg total) by mouth daily. 09/17/19 04/27/20  Tommie Sams, DO    Family History Family History  Problem Relation Age of Onset   Healthy Mother     Social History Social History   Tobacco Use   Smoking status: Never   Smokeless tobacco: Never  Vaping Use   Vaping status: Never Used  Substance Use Topics   Alcohol use: Not Currently   Drug use: Never     Allergies   Hydrocodone-acetaminophen   Review of Systems Review of Systems  Eyes:  Positive for visual disturbance.  Respiratory:  Negative for chest tightness and shortness of breath.   Cardiovascular:  Negative for chest pain and leg swelling.  Skin:  Positive for rash.  Neurological:  Positive for light-headedness and headaches. Negative for dizziness, weakness and numbness.     Physical Exam Triage Vital Signs ED Triage Vitals  Encounter Vitals Group     BP 09/24/22 0819 (!) 173/97     Systolic BP Percentile --      Diastolic BP Percentile --      Pulse Rate 09/24/22 0819 69     Resp 09/24/22 0819 16     Temp 09/24/22 0819 98.1 F (36.7 C)     Temp Source 09/24/22 0819 Oral  SpO2 09/24/22 0819 95 %     Weight 09/24/22 0817 (!) 365 lb (165.6 kg)     Height 09/24/22 0817 5\' 7"  (1.702 m)     Head Circumference --      Peak Flow --      Pain Score 09/24/22 0817 0     Pain Loc --      Pain Education --      Exclude from Growth Chart --    No data found.  Updated Vital Signs BP (!) 173/97 (BP Location: Right Arm)   Pulse 69   Temp 98.1 F (36.7 C) (Oral)   Resp 16   Ht 5\' 7"  (1.702 m)   Wt (!) 365 lb (165.6 kg)   SpO2 95%   BMI 57.17 kg/m      Physical Exam Constitutional:      General: He is not in acute distress.    Appearance: He is obese.  HENT:     Head: Normocephalic.  Cardiovascular:     Rate and Rhythm: Normal rate and regular rhythm.     Heart sounds: Normal heart sounds. No murmur heard.    No friction rub. No  gallop.  Pulmonary:     Effort: Pulmonary effort is normal.     Breath sounds: Normal breath sounds. No wheezing, rhonchi or rales.  Skin:    General: Skin is warm and dry.     Findings: Rash present.     Comments: Scattered pustules noted at the base of the hair follicles throughout the scalp  Neurological:     General: No focal deficit present.     Mental Status: He is alert and oriented to person, place, and time.     Gait: Gait normal.      UC Treatments / Results  Labs   EKG   Radiology   Procedures   Medications Ordered in UC Medications - No data to display  Initial Impression / Assessment and Plan / UC Course  I have reviewed the triage vital signs and the nursing notes.  Pertinent labs & imaging results that were available during my care of the patient were reviewed by me and considered in my medical decision making (see chart for details).     52 year old male with recurrent rash to scalp status post shaving daily.  Exam consistent with folliculitis barbae.  He uses a new razor with every shave.  Rx for doxycycline 100 mg twice daily x 10 days.  Also diagnosed with hypertension.  Will treat with amlodipine-olmesartan 5-20 mg daily.  Encouraged low-salt diet and exercise for weight loss.  Advised him to follow-up with his PCP for further evaluation of hypertension.  Final Clinical Impressions(s) / UC Diagnoses   Final diagnoses:  Folliculitis barbae  Primary hypertension  Morbid obesity (HCC)     Discharge Instructions      You were seen today for a rash of your scalp.  This is consistent with folliculitis barbae.  Continue use a new razor each time you shave.  I am putting you on antibiotics twice daily for the next 10 days.  Your blood pressure was also elevated so we are starting you on amlodipine-olmesartan.  Take this daily in the morning.  Try to consume a low-salt diet and exercise for weight loss.  Please follow-up with your primary care physician  in 1 week for reevaluation of your blood pressure.     ED Prescriptions     Medication Sig Dispense Auth. Provider  doxycycline (VIBRAMYCIN) 100 MG capsule Take 1 capsule (100 mg total) by mouth 2 (two) times daily. 20 capsule Lorre Munroe, NP   amLODipine-olmesartan (AZOR) 5-20 MG tablet Take 1 tablet by mouth daily. 30 tablet Lorre Munroe, NP      PDMP not reviewed this encounter.   Lorre Munroe, NP     Lorre Munroe, NP 09/24/22 1000

## 2022-09-24 NOTE — Discharge Instructions (Signed)
You were seen today for a rash of your scalp.  This is consistent with folliculitis barbae.  Continue use a new razor each time you shave.  I am putting you on antibiotics twice daily for the next 10 days.  Your blood pressure was also elevated so we are starting you on amlodipine-olmesartan.  Take this daily in the morning.  Try to consume a low-salt diet and exercise for weight loss.  Please follow-up with your primary care physician in 1 week for reevaluation of your blood pressure.

## 2023-05-05 ENCOUNTER — Telehealth: Admitting: Emergency Medicine

## 2023-05-05 DIAGNOSIS — L739 Follicular disorder, unspecified: Secondary | ICD-10-CM

## 2023-05-05 MED ORDER — TRIAMCINOLONE ACETONIDE 0.1 % EX OINT: 1.0000 | TOPICAL_OINTMENT | Freq: Two times a day (BID) | CUTANEOUS | 1 refills | Status: AC

## 2023-05-05 MED ORDER — DOXYCYCLINE HYCLATE 100 MG PO CAPS
100.0000 mg | ORAL_CAPSULE | Freq: Two times a day (BID) | ORAL | 0 refills | Status: AC
Start: 2023-05-05 — End: ?

## 2023-05-05 NOTE — Progress Notes (Signed)
 Virtual Visit Consent   Levi Greaser., you are scheduled for a virtual visit with a [facility] provider today. Just as with appointments in the office, your consent must be obtained to participate. Your consent will be active for this visit and any virtual visit you may have with one of our providers in the next 365 days. If you have a MyChart account, a copy of this consent can be sent to you electronically.  As this is a virtual visit, video technology does not allow for your provider to perform a traditional examination. This may limit your provider's ability to fully assess your condition. If your provider identifies any concerns that need to be evaluated in person or the need to arrange testing (such as labs, EKG, etc.), we will make arrangements to do so. Although advances in technology are sophisticated, we cannot ensure that it will always work on either your end or our end. If the connection with a video visit is poor, the visit may have to be switched to a telephone visit. With either a video or telephone visit, we are not always able to ensure that we have a secure connection.  By engaging in this virtual visit, you consent to the provision of healthcare and authorize for your insurance to be billed (if applicable) for the services provided during this visit. Depending on your insurance coverage, you may receive a charge related to this service.  I need to obtain your verbal consent now. Are you willing to proceed with your visit today? Levi E Kaleb Sek. has provided verbal consent on 05/05/2023 for a virtual visit (video or telephone). Roxy Horseman, PA-C  Date: 05/05/2023 10:03 AM   Virtual Visit via Video Note   I, Roxy Horseman, connected with  Levi Payne.  ([id number], 01/06/1971) on 05/05/23 at 10:00 AM EDT by a video-enabled telemedicine application and verified that I am speaking with the correct person using two identifiers.  Location: Patient: Virtual Visit  Location Patient: Home Provider: Virtual Visit Location Provider: Home Office   I discussed the limitations of evaluation and management by telemedicine and the availability of in person appointments. The patient expressed understanding and agreed to proceed.    History of Present Illness: Levi Pasillas. is a 53 y.o. who identifies as a male who was assigned male at birth, and is being seen today for scalp irritation.  States that he has had similar in the past.  He associated the rash with shaving his head.  He denies fever.  He states that it has some itch and some pain.  He has tried using OTC aftershave creams without relief.  Was treated with doxy and kenalog cream in the past which worked well.  HPI: HPI  Problems: There are no active problems to display for this patient.   Allergies:  Allergies  Allergen Reactions   Hydrocodone-Acetaminophen Anxiety   Medications:  Current Outpatient Medications:    doxycycline (VIBRAMYCIN) 100 MG capsule, Take 1 capsule (100 mg total) by mouth 2 (two) times daily., Disp: 20 capsule, Rfl: 0   amLODipine-olmesartan (AZOR) 5-20 MG tablet, Take 1 tablet by mouth daily., Disp: 30 tablet, Rfl: 0   LUTEIN PO, Take 1 tablet by mouth daily., Disp: , Rfl:    meloxicam (MOBIC) 15 MG tablet, Take by mouth., Disp: , Rfl:    triamcinolone ointment (KENALOG) 0.1 %, Apply 1 Application topically 2 (two) times daily., Disp: 30 g, Rfl: 1  Observations/Objective: Patient is well-developed,  well-nourished in no acute distress.  Resting comfortably at home.  Head is normocephalic, atraumatic.  No labored breathing.  Speech is clear and coherent with logical content.  Patient is alert and oriented at baseline.  Scattered red papules on scalp   Assessment and Plan: 1. Folliculitis (Primary)  Meds ordered this encounter  Medications   doxycycline (VIBRAMYCIN) 100 MG capsule    Sig: Take 1 capsule (100 mg total) by mouth 2 (two) times daily.    Dispense:   20 capsule    Refill:  0    Supervising Provider:   Merrilee Jansky [[id number]]   triamcinolone ointment (KENALOG) 0.1 %    Sig: Apply 1 Application topically 2 (two) times daily.    Dispense:  30 g    Refill:  1    Supervising Provider:   Merrilee Jansky [id number]      Follow Up Instructions: I discussed the assessment and treatment plan with the patient. The patient was provided an opportunity to ask questions and all were answered. The patient agreed with the plan and demonstrated an understanding of the instructions.  A copy of instructions were sent to the patient via MyChart unless otherwise noted below.     The patient was advised to call back or seek an in-person evaluation if the symptoms worsen or if the condition fails to improve as anticipated.    Roxy Horseman, PA-C

## 2023-05-05 NOTE — Patient Instructions (Signed)
  Leshon E Crista Payne., thank you for joining Roxy Horseman, PA-C for today's virtual visit.  While this provider is not your primary care provider (PCP), if your PCP is located in our provider database this encounter information will be shared with them immediately following your visit.   A [facility] MyChart account gives you access to today's visit and all your visits, tests, and labs performed at [facility] " click here if you don't have a [facility] MyChart account or go to [email or web address]  Consent: (Patient) Levi Payne. provided verbal consent for this virtual visit at the beginning of the encounter.  Current Medications:  Current Outpatient Medications:    doxycycline (VIBRAMYCIN) 100 MG capsule, Take 1 capsule (100 mg total) by mouth 2 (two) times daily., Disp: 20 capsule, Rfl: 0   amLODipine-olmesartan (AZOR) 5-20 MG tablet, Take 1 tablet by mouth daily., Disp: 30 tablet, Rfl: 0   LUTEIN PO, Take 1 tablet by mouth daily., Disp: , Rfl:    meloxicam (MOBIC) 15 MG tablet, Take by mouth., Disp: , Rfl:    triamcinolone ointment (KENALOG) 0.1 %, Apply 1 Application topically 2 (two) times daily., Disp: 30 g, Rfl: 1   Medications ordered in this encounter:  Meds ordered this encounter  Medications   doxycycline (VIBRAMYCIN) 100 MG capsule    Sig: Take 1 capsule (100 mg total) by mouth 2 (two) times daily.    Dispense:  20 capsule    Refill:  0    Supervising Provider:   Merrilee Jansky [[id number]]   triamcinolone ointment (KENALOG) 0.1 %    Sig: Apply 1 Application topically 2 (two) times daily.    Dispense:  30 g    Refill:  1    Supervising Provider:   Merrilee Jansky [id number]     *If you need refills on other medications prior to your next appointment, please contact your pharmacy*  Follow-Up: Call back or seek an in-person evaluation if the symptoms worsen or if the condition fails to improve as anticipated.  [facility] [phone]  Other Instructions    If you have been instructed to have an in-person evaluation today at a local Urgent Care facility, please use the link below. It will take you to a list of all of our available [facility], including address, phone number and hours of operation. Please do not delay care.  [facility]  If you or a family member do not have a primary care provider, use the link below to schedule a visit and establish care. When you choose a [facility] primary care physician or advanced practice provider, you gain a long-term partner in health. Find a Primary Care Provider  Learn more about [facility] in-office and virtual care options: [facility] - Get Care Now
